# Patient Record
Sex: Female | Born: 1970 | Race: Black or African American | Hispanic: No | Marital: Married | Smoking: Never smoker
Health system: Southern US, Community
[De-identification: ages and names within clinical notes are randomized; demographics above are authoritative.]

## PROBLEM LIST (undated history)

## (undated) DIAGNOSIS — N809 Endometriosis, unspecified: Secondary | ICD-10-CM

## (undated) HISTORY — PX: COLOSTOMY: SHX63

## (undated) HISTORY — PX: FLEXIBLE SIGMOIDOSCOPY: SHX1649

## (undated) HISTORY — PX: ABDOMINAL HYSTERECTOMY: SHX81

---

## 2016-12-07 ENCOUNTER — Emergency Department (HOSPITAL_COMMUNITY)
Admission: EM | Admit: 2016-12-07 | Discharge: 2016-12-07 | Disposition: A | Discharge status: Home / Self Care | Payer: BLUE CROSS/BLUE SHIELD | Attending: Emergency Medicine | Admitting: Emergency Medicine

## 2016-12-07 ENCOUNTER — Encounter (HOSPITAL_COMMUNITY): Payer: Self-pay | Admitting: *Deleted

## 2016-12-07 ENCOUNTER — Emergency Department (HOSPITAL_COMMUNITY): Payer: BLUE CROSS/BLUE SHIELD

## 2016-12-07 DIAGNOSIS — R1032 Left lower quadrant pain: Secondary | ICD-10-CM | POA: Insufficient documentation

## 2016-12-07 DIAGNOSIS — K5900 Constipation, unspecified: Secondary | ICD-10-CM | POA: Insufficient documentation

## 2016-12-07 DIAGNOSIS — R109 Unspecified abdominal pain: Secondary | ICD-10-CM | POA: Diagnosis present

## 2016-12-07 HISTORY — DX: Endometriosis, unspecified: N80.9

## 2016-12-07 LAB — URINALYSIS, ROUTINE W REFLEX MICROSCOPIC
Bilirubin Urine: NEGATIVE
GLUCOSE, UA: NEGATIVE mg/dL
Hgb urine dipstick: NEGATIVE
KETONES UR: NEGATIVE mg/dL
Nitrite: NEGATIVE
PH: 6 (ref 5.0–8.0)
Protein, ur: NEGATIVE mg/dL
SPECIFIC GRAVITY, URINE: 1.026 (ref 1.005–1.030)

## 2016-12-07 LAB — CBC
HCT: 39.2 % (ref 36.0–46.0)
Hemoglobin: 13.3 g/dL (ref 12.0–15.0)
MCH: 30.2 pg (ref 26.0–34.0)
MCHC: 33.9 g/dL (ref 30.0–36.0)
MCV: 88.9 fL (ref 78.0–100.0)
PLATELETS: 232 10*3/uL (ref 150–400)
RBC: 4.41 MIL/uL (ref 3.87–5.11)
RDW: 12.7 % (ref 11.5–15.5)
WBC: 10.7 10*3/uL — ABNORMAL HIGH (ref 4.0–10.5)

## 2016-12-07 LAB — COMPREHENSIVE METABOLIC PANEL
ALBUMIN: 3.9 g/dL (ref 3.5–5.0)
ALT: 13 U/L — AB (ref 14–54)
AST: 21 U/L (ref 15–41)
Alkaline Phosphatase: 62 U/L (ref 38–126)
Anion gap: 7 (ref 5–15)
BILIRUBIN TOTAL: 0.6 mg/dL (ref 0.3–1.2)
BUN: 11 mg/dL (ref 6–20)
CALCIUM: 9 mg/dL (ref 8.9–10.3)
CO2: 26 mmol/L (ref 22–32)
CREATININE: 0.87 mg/dL (ref 0.44–1.00)
Chloride: 105 mmol/L (ref 101–111)
GFR calc Af Amer: >60 mL/min (ref >60)
GLUCOSE: 125 mg/dL — AB (ref 65–99)
POTASSIUM: 3.6 mmol/L (ref 3.5–5.1)
Sodium: 138 mmol/L (ref 135–145)
Total Protein: 7.5 g/dL (ref 6.5–8.1)

## 2016-12-07 LAB — I-STAT CG4 LACTIC ACID, ED: Lactic Acid, Venous: 0.78 mmol/L (ref 0.5–1.9)

## 2016-12-07 LAB — LIPASE, BLOOD: Lipase: 38 U/L (ref 11–51)

## 2016-12-07 MED ORDER — IOPAMIDOL (ISOVUE-300) INJECTION 61%
100.0000 mL | Freq: Once | INTRAVENOUS | Status: AC | PRN
  Administered 2016-12-07: 100 mL via INTRAVENOUS

## 2016-12-07 NOTE — Discharge Instructions (Signed)
START TAKING MIRALAX AND COLACE FOR CONSTIPATION. INCREASE MIRALAX AS NEEDED FOR SOFT BOWEL MOVEMENTS. RETURN TO ER IF ANY FEVER, WORSENING PAIN, VOMITING, OR BLOODY STOOLS.

## 2016-12-07 NOTE — ED Provider Notes (Signed)
[facility]-EMERGENCY DEPT Provider Note   CSN: [id number] Arrival date & time: 12/07/16  1551     History   Chief Complaint Chief Complaint  Patient presents with  . Abdominal Pain    HPI Jennifer Hooper is a 46 y.o. female.  46 year old female with past medical history including endometriosis, colon resection, colonic strictures, hysterectomy who p/w abdominal pain. 5 days ago, the patient had a sigmoidoscopy with a dilation in [facility]; she has a history of bowel problems related to endometriosis and previous colon resection. She traveled here for a conference and over the past few days has started having left-sided abdominal pain that occasionally radiates to her back. She initially thought that it was because she was constipated and she took a laxative. She has had several bowel movements and initially felt some relief but her abdominal pain returned and has been persistent despite having several bowel movements today. She had an episode of nausea yesterday but none today and she denies any associated vomiting. No fevers, dysuria, hematuria, or history of kidney stones. She spoke with her gastroenterologist who recommended evaluation in the ED. No cough/cold symptoms or recent illness.   The history is provided by the patient.    Past Medical History:  Diagnosis Date  . Endometriosis     There are no active problems to display for this patient.   Past Surgical History:  Procedure Laterality Date  . ABDOMINAL HYSTERECTOMY    . COLOSTOMY    . FLEXIBLE SIGMOIDOSCOPY      OB History    No data available       Home Medications    Prior to Admission medications   Not on File    Family History History reviewed. No pertinent family history.  Social History Social History  Substance Use Topics  . Smoking status: Never Smoker  . Smokeless tobacco: Not on file  . Alcohol use No     Allergies   Patient has no known allergies.   Review of Systems Review of Systems All  other systems reviewed and are negative except that which was mentioned in HPI  Physical Exam Updated Vital Signs BP 126/74 (BP Location: Right Arm)   Pulse 76   Temp 98.3 F (36.8 C) (Oral)   Resp 16   SpO2 99%   Physical Exam  Constitutional: She is oriented to person, place, and time. She appears well-developed and well-nourished. No distress.  HENT:  Head: Normocephalic and atraumatic.  Mouth/Throat: Oropharynx is clear and moist.  Moist mucous membranes  Eyes: Conjunctivae are normal. Pupils are equal, round, and reactive to light.  Neck: Neck supple.  Cardiovascular: Normal rate, regular rhythm and normal heart sounds.   No murmur heard. Pulmonary/Chest: Effort normal and breath sounds normal.  Abdominal: Soft. Bowel sounds are normal. She exhibits no distension. There is tenderness in the suprapubic area, left upper quadrant and left lower quadrant. There is no rebound and no guarding.  Musculoskeletal: She exhibits no edema.  Neurological: She is alert and oriented to person, place, and time.  Fluent speech  Skin: Skin is warm and dry.  Psychiatric: She has a normal mood and affect. Judgment normal.  Nursing note and vitals reviewed.    ED Treatments / Results  Labs (all labs ordered are listed, but only abnormal results are displayed) Labs Reviewed  COMPREHENSIVE METABOLIC PANEL - Abnormal; Notable for the following:       Result Value   Glucose, Bld 125 (*)    ALT 13 (*)  All other components within normal limits  CBC - Abnormal; Notable for the following:    WBC 10.7 (*)    All other components within normal limits  URINALYSIS, ROUTINE W REFLEX MICROSCOPIC - Abnormal; Notable for the following:    APPearance HAZY (*)    Leukocytes, UA MODERATE (*)    Bacteria, UA FEW (*)    Squamous Epithelial / LPF 0-5 (*)    All other components within normal limits  URINE CULTURE  LIPASE, BLOOD  I-STAT CG4 LACTIC ACID, ED    EKG  EKG Interpretation None        Radiology Ct Abdomen Pelvis W Contrast  Result Date: 12/07/2016 CLINICAL DATA:  LEFT lower quadrant/suprapubic pain for 2 days. Status post sigmoidoscopy with dilatation 6 days ago. History of colostomy and multiple abdominal surgeries. EXAM: CT ABDOMEN AND PELVIS WITH CONTRAST TECHNIQUE: Multidetector CT imaging of the abdomen and pelvis was performed using the standard protocol following bolus administration of intravenous contrast. CONTRAST:  ISOVUE-300 IOPAMIDOL (ISOVUE-300) INJECTION 61% COMPARISON:  None. FINDINGS: LOWER CHEST: Lung bases are clear. Included heart size is normal. No pericardial effusion. HEPATOBILIARY: Liver and gallbladder are normal. PANCREAS: Normal. SPLEEN: Normal. ADRENALS/URINARY TRACT: Kidneys are orthotopic, demonstrating symmetric enhancement. No nephrolithiasis, hydronephrosis or solid renal masses. The unopacified ureters are normal in course and caliber. Delayed imaging through the kidneys demonstrates symmetric prompt contrast excretion within the proximal urinary collecting system. Urinary bladder is partially distended and unremarkable. Normal adrenal glands. STOMACH/BOWEL: Limited assessment for bowel pathology without contrast. Rectosigmoid and transverse colon surgical anastomoses. Mild descending to sigmoid sigmoid colonic wall thickening and pericolonic fat stranding. Moderate to large volume retained large bowel stool. VASCULAR/LYMPHATIC: Aortoiliac vessels are normal in course and caliber. No lymphadenopathy by CT size criteria. REPRODUCTIVE: Status post hysterectomy. 9 mm Gardner's versus low-lying nabothian cyst. OTHER: No intraperitoneal free fluid or free air. MUSCULOSKELETAL: Nonacute.  Anterior abdominal wall scarring. IMPRESSION: Multiple large bowel surgical anastomoses without bowel obstruction. Mild LEFT colitis, this could be post procedural. Moderate to large volume retained large bowel stool. Electronically Signed   By: Awilda Metro M.D.    On: 12/07/2016 21:20    Procedures Procedures (including critical care time)  Medications Ordered in ED Medications  iopamidol (ISOVUE-300) 61 % injection 100 mL (100 mLs Intravenous Contrast Given 12/07/16 2050)     Initial Impression / Assessment and Plan / ED Course  I have reviewed the triage vital signs and the nursing notes.  Pertinent labs & imaging results that were available during my care of the patient were reviewed by me and considered in my medical decision making (see chart for details).    Pt w/ several days of persistent L sided abd pain radiating to back, recent sigmoidoscopy with dilation. On exam, she was nontoxic with normal vital signs, no acute distress. Afebrile. She had suprapubic, left lower quadrant, and left upper quadrant tenderness with no rebound or guarding. No peritonitis. Her lab work is overall unremarkable. Because of her recent sigmoidoscopy with dilation, differential includes bowel perforation versus other etiologies such as diverticulitis. No urinary symptoms as kidney stone seems less likely. Obtained CT to evaluate for acute intra-abdominal process.  CT showed no evidence of obstruction or perforation, mild left colitis likely postprocedural. She did have a moderate to large amount of stool consistent with constipation. She was well-appearing on reexamination. Discussed CT findings and instructed patient on constipation treatment with MiraLAX and Colace. Her lab work here is reassuring. UA did have  moderate leukocytes with small amount of wbc's, bacteria, and squamous cells. I ordered a urine culture but the patient has no urinary symptoms and no comorbidities such as diabetes and I feel her description of sx is more c/w constipation than UTI. I have discussed follow-up with her gastroenterologist and extensively reviewed return precautions with her. She voiced understanding of plan and was discharged in satisfactory condition. Final Clinical Impressions(s)  / ED Diagnoses   Final diagnoses:  Left lower quadrant pain  Constipation, unspecified constipation type    New Prescriptions There are no discharge medications for this patient.    Laurence Spates, MD 12/07/16 [phone]

## 2016-12-07 NOTE — ED Triage Notes (Signed)
Pt reports having sigmoidoscopy done on Friday. Having left side abd pain that radiates around to her back. Pt took laxative and rested. Had temporary relief only. Had nausea yesterday, no vomiting.

## 2016-12-09 LAB — URINE CULTURE

## 2018-06-07 IMAGING — CT CT ABD-PELV W/ CM
2 of 5 series · 15 of 46 positions shown, 17 images · IV contrast (APPLIED)
Comparison: None.

CLINICAL DATA: LEFT lower quadrant/suprapubic pain for 2 days.
Status post sigmoidoscopy with dilatation 6 days ago. History of
colostomy and multiple abdominal surgeries.

EXAM:
CT ABDOMEN AND PELVIS WITH CONTRAST
TECHNIQUE: Multidetector CT imaging of the abdomen and pelvis was performed
using the standard protocol following bolus administration of
intravenous contrast.
CONTRAST:  100mL EUCVY1-HSS IOPAMIDOL (EUCVY1-HSS) INJECTION 61%

[Series 3: abd/ pelvis 5.0 i30f 2 · axial · 0.57mm/px · z∈[+966,+1326]mm · 12 of 82 slices shown, 14 images]
[im 5/82  soft-tissue]
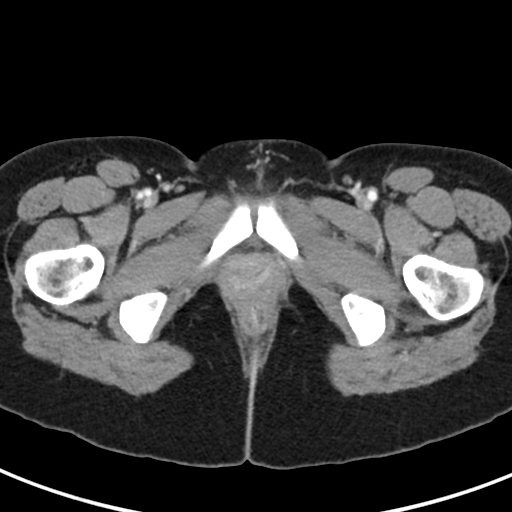
[im 5/82  bone]
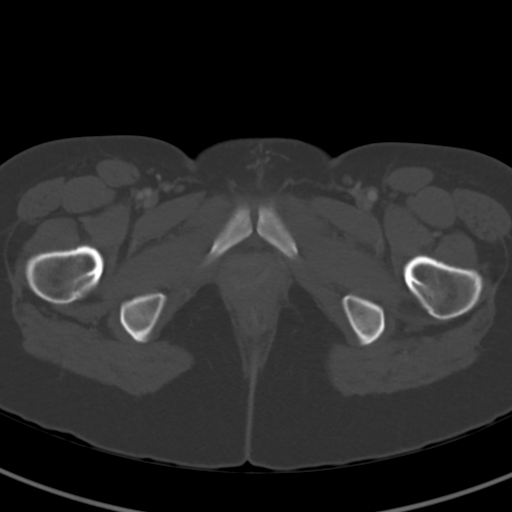
[im 13/82  soft-tissue]
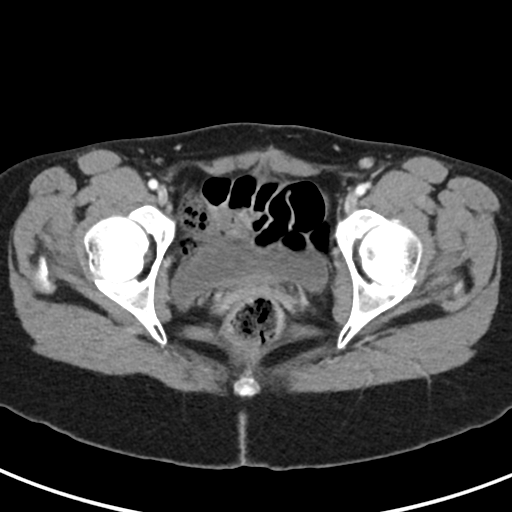
[im 18/82  soft-tissue]
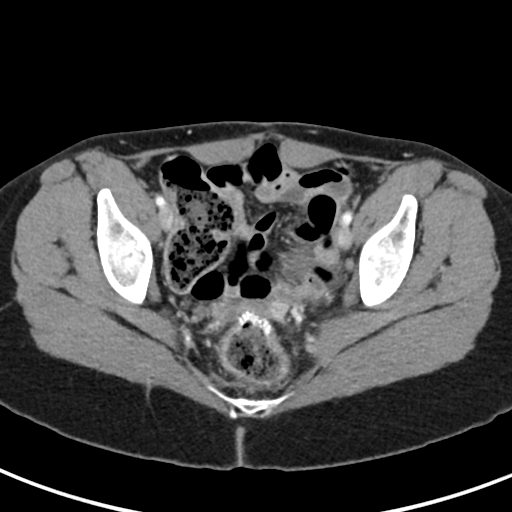
[im 26/82  soft-tissue]
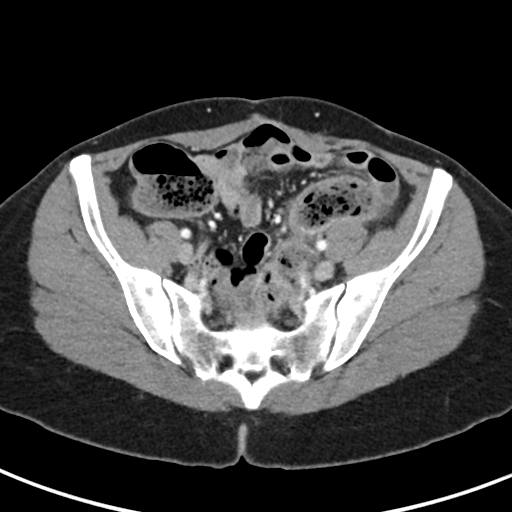
[im 30/82  soft-tissue]
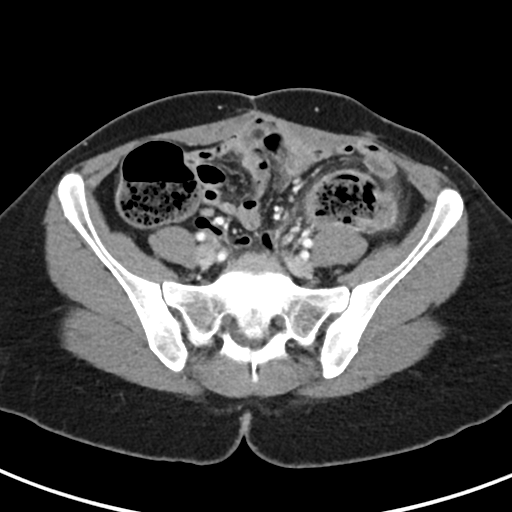
[im 39/82  soft-tissue]
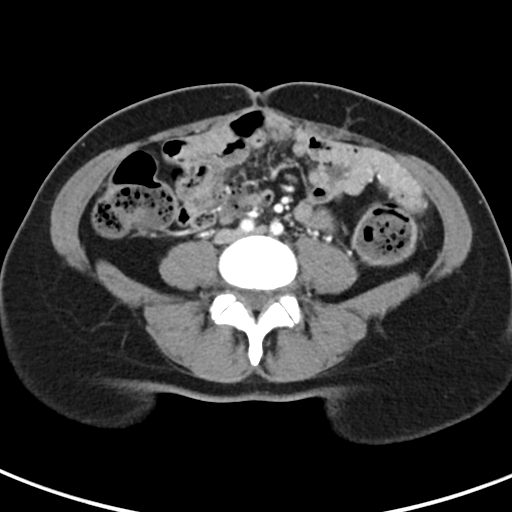
[im 43/82  soft-tissue]
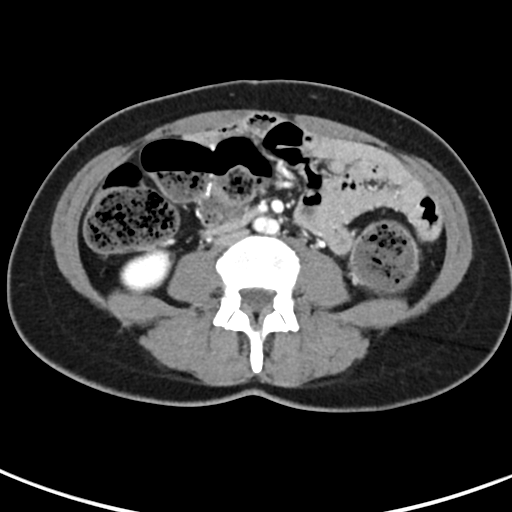
[im 52/82  soft-tissue]
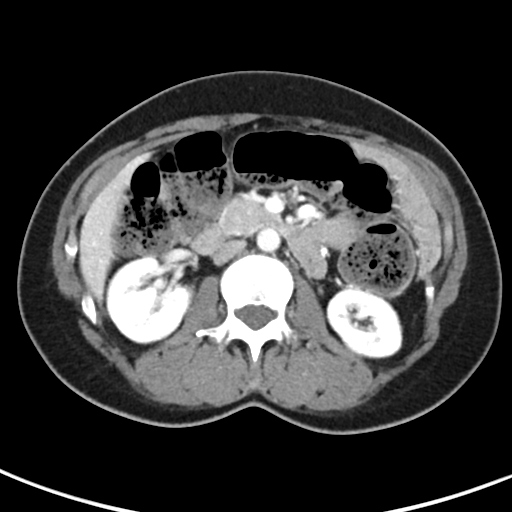
[im 56/82  soft-tissue]
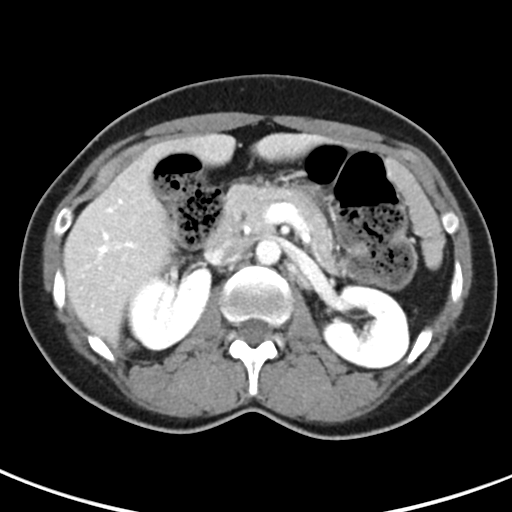
[im 56/82  bone]
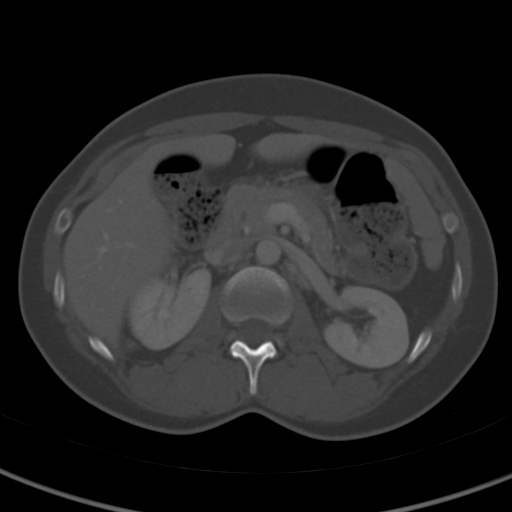
[im 64/82  soft-tissue]
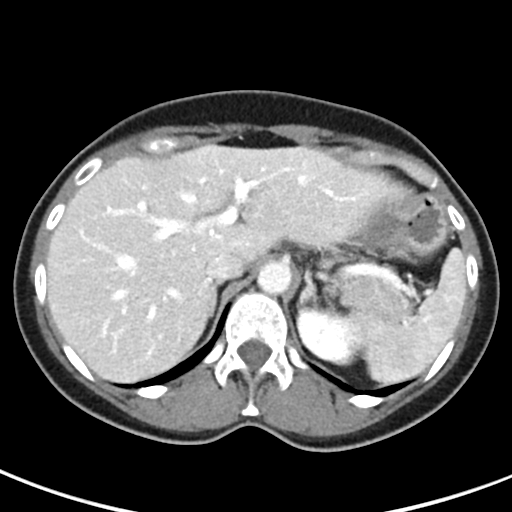
[im 69/82  soft-tissue]
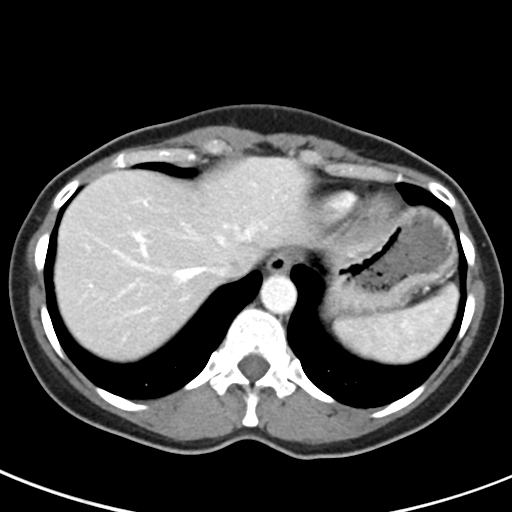
[im 77/82  soft-tissue]
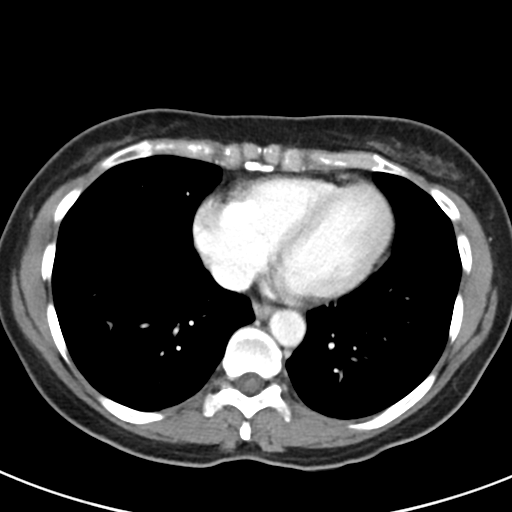

[Series 6: coronal soft tissue · coronal · 0.58mm/px · 3 of 66 slices shown]
[im 22/66  soft-tissue]
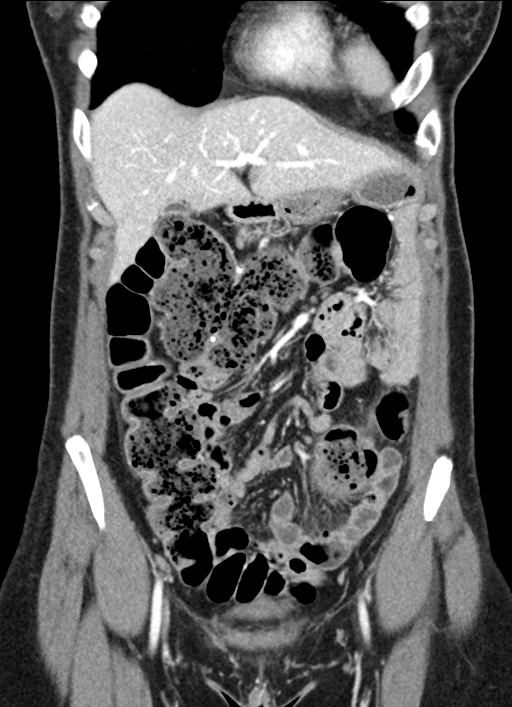
[im 29/66  soft-tissue]
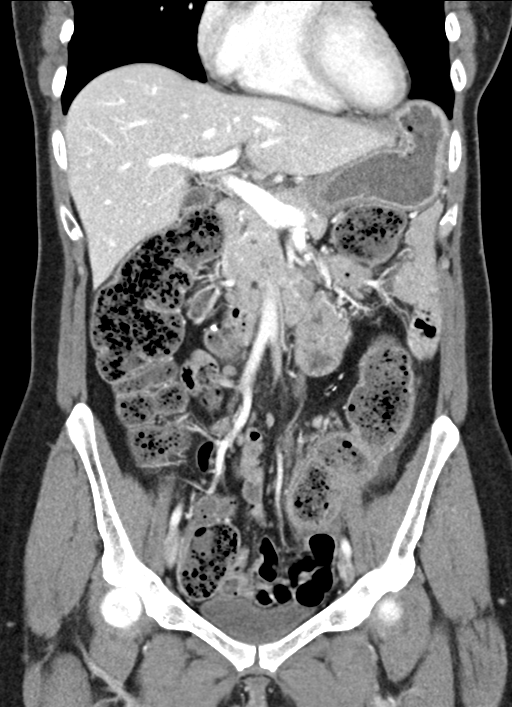
[im 37/66  soft-tissue]
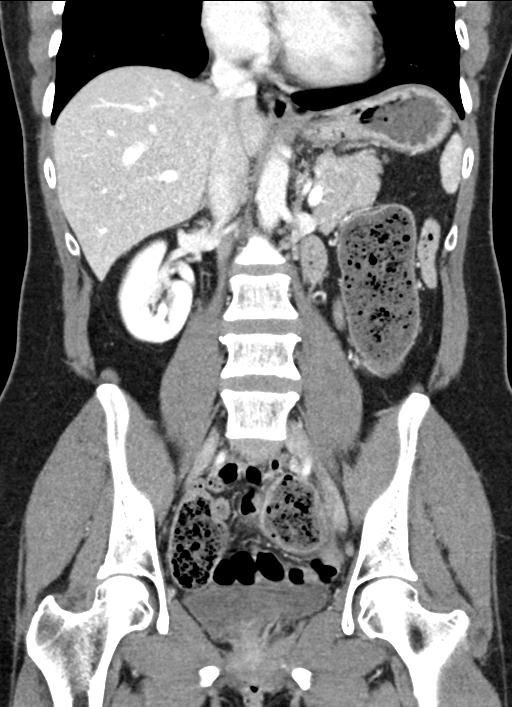

[15 of 46 positions shown; findings below may reference images not displayed]

FINDINGS: LOWER CHEST: Lung bases are clear. Included heart size is normal. No
pericardial effusion.

HEPATOBILIARY: Liver and gallbladder are normal.

PANCREAS: Normal.

SPLEEN: Normal.

ADRENALS/URINARY TRACT: Kidneys are orthotopic, demonstrating
symmetric enhancement. No nephrolithiasis, hydronephrosis or solid
renal masses. The unopacified ureters are normal in course and
caliber. Delayed imaging through the kidneys demonstrates symmetric
prompt contrast excretion within the proximal urinary collecting
system. Urinary bladder is partially distended and unremarkable.
Normal adrenal glands.

STOMACH/BOWEL: Limited assessment for bowel pathology without
contrast. Rectosigmoid and transverse colon surgical anastomoses.
Mild descending to sigmoid sigmoid colonic wall thickening and
pericolonic fat stranding. Moderate to large volume retained large
bowel stool.

VASCULAR/LYMPHATIC: Aortoiliac vessels are normal in course and
caliber. No lymphadenopathy by CT size criteria.

REPRODUCTIVE: Status post hysterectomy. 9 mm Brynjar Orri versus
low-lying nabothian cyst.

OTHER: No intraperitoneal free fluid or free air.

MUSCULOSKELETAL: Nonacute.  Anterior abdominal wall scarring.
IMPRESSION: Multiple large bowel surgical anastomoses without bowel obstruction.
Mild LEFT colitis, this could be post procedural.

Moderate to large volume retained large bowel stool.
